# Patient Record
Sex: Female | Born: 1960 | Race: White | Hispanic: No | Marital: Married | Smoking: Former smoker
Health system: Southern US, Community
[De-identification: ages and names within clinical notes are randomized; demographics above are authoritative.]

## PROBLEM LIST (undated history)

## (undated) DIAGNOSIS — D649 Anemia, unspecified: Secondary | ICD-10-CM

## (undated) DIAGNOSIS — F419 Anxiety disorder, unspecified: Secondary | ICD-10-CM

## (undated) HISTORY — PX: TUBAL LIGATION: SHX77

## (undated) HISTORY — PX: COLONOSCOPY: SHX174

---

## 2016-11-17 ENCOUNTER — Other Ambulatory Visit: Payer: Self-pay | Admitting: Obstetrics and Gynecology

## 2016-11-17 DIAGNOSIS — Z1231 Encounter for screening mammogram for malignant neoplasm of breast: Secondary | ICD-10-CM

## 2016-12-09 ENCOUNTER — Encounter: Payer: Self-pay | Admitting: Radiology

## 2016-12-09 ENCOUNTER — Ambulatory Visit
Admission: RE | Admit: 2016-12-09 | Discharge: 2016-12-09 | Disposition: A | Discharge status: Home / Self Care | Payer: Managed Care, Other (non HMO) | Source: Ambulatory Visit | Attending: Obstetrics and Gynecology | Admitting: Obstetrics and Gynecology

## 2016-12-09 DIAGNOSIS — Z1231 Encounter for screening mammogram for malignant neoplasm of breast: Secondary | ICD-10-CM | POA: Diagnosis not present

## 2016-12-14 ENCOUNTER — Inpatient Hospital Stay
Admission: RE | Admit: 2016-12-14 | Discharge: 2016-12-14 | Disposition: A | Discharge status: Home / Self Care | Payer: Self-pay | Source: Ambulatory Visit | Attending: *Deleted | Admitting: *Deleted

## 2016-12-14 ENCOUNTER — Other Ambulatory Visit: Payer: Self-pay | Admitting: *Deleted

## 2016-12-14 DIAGNOSIS — Z9289 Personal history of other medical treatment: Secondary | ICD-10-CM

## 2017-11-17 ENCOUNTER — Other Ambulatory Visit: Payer: Self-pay | Admitting: Obstetrics and Gynecology

## 2017-11-17 DIAGNOSIS — Z1231 Encounter for screening mammogram for malignant neoplasm of breast: Secondary | ICD-10-CM

## 2017-12-20 ENCOUNTER — Ambulatory Visit
Admission: RE | Admit: 2017-12-20 | Discharge: 2017-12-20 | Disposition: A | Discharge status: Home / Self Care | Payer: Managed Care, Other (non HMO) | Source: Ambulatory Visit | Attending: Obstetrics and Gynecology | Admitting: Obstetrics and Gynecology

## 2017-12-20 DIAGNOSIS — Z1231 Encounter for screening mammogram for malignant neoplasm of breast: Secondary | ICD-10-CM | POA: Insufficient documentation

## 2018-11-21 ENCOUNTER — Other Ambulatory Visit: Payer: Self-pay | Admitting: Obstetrics and Gynecology

## 2018-11-21 DIAGNOSIS — Z1231 Encounter for screening mammogram for malignant neoplasm of breast: Secondary | ICD-10-CM

## 2019-01-16 ENCOUNTER — Ambulatory Visit
Admission: RE | Admit: 2019-01-16 | Discharge: 2019-01-16 | Disposition: A | Discharge status: Home / Self Care | Payer: Managed Care, Other (non HMO) | Source: Ambulatory Visit | Attending: Obstetrics and Gynecology | Admitting: Obstetrics and Gynecology

## 2019-01-16 DIAGNOSIS — Z1231 Encounter for screening mammogram for malignant neoplasm of breast: Secondary | ICD-10-CM | POA: Insufficient documentation

## 2019-01-18 ENCOUNTER — Other Ambulatory Visit: Payer: Self-pay

## 2019-01-18 DIAGNOSIS — Z20822 Contact with and (suspected) exposure to covid-19: Secondary | ICD-10-CM

## 2019-01-19 LAB — NOVEL CORONAVIRUS, NAA: SARS-CoV-2, NAA: NOT DETECTED

## 2019-01-19 LAB — SPECIMEN STATUS REPORT

## 2019-03-02 ENCOUNTER — Other Ambulatory Visit: Payer: Self-pay

## 2019-03-02 DIAGNOSIS — Z20822 Contact with and (suspected) exposure to covid-19: Secondary | ICD-10-CM

## 2019-03-03 LAB — NOVEL CORONAVIRUS, NAA: SARS-CoV-2, NAA: NOT DETECTED

## 2019-03-09 ENCOUNTER — Other Ambulatory Visit: Payer: Self-pay | Admitting: Internal Medicine

## 2019-03-09 DIAGNOSIS — Z20822 Contact with and (suspected) exposure to covid-19: Secondary | ICD-10-CM

## 2019-03-10 LAB — NOVEL CORONAVIRUS, NAA: SARS-CoV-2, NAA: NOT DETECTED

## 2019-08-30 ENCOUNTER — Ambulatory Visit: Payer: Managed Care, Other (non HMO) | Attending: Internal Medicine

## 2019-08-30 DIAGNOSIS — Z23 Encounter for immunization: Secondary | ICD-10-CM

## 2019-08-30 NOTE — Progress Notes (Signed)
   Covid-19 Vaccination Clinic  Name:  Terina Mcelhinny    MRN: [id number] DOB: April 02, 1961  08/30/2019  Ms. Salatino was observed post Covid-19 immunization for 15 minutes without incident. She was provided with Vaccine Information Sheet and instruction to access the V-Safe system.   Ms. Knoche was instructed to call 911 with any severe reactions post vaccine: Marland Kitchen Difficulty breathing  . Swelling of face and throat  . A fast heartbeat  . A bad rash all over body  . Dizziness and weakness   Immunizations Administered    Name Date Dose VIS Date Route   Pfizer COVID-19 Vaccine 08/30/2019 12:33 PM 0.3 mL 05/25/2019 Intramuscular   Manufacturer: [employer], [employer]   Lot: [id number]   NDC: [address]-7

## 2019-09-24 ENCOUNTER — Ambulatory Visit: Payer: Managed Care, Other (non HMO) | Attending: Internal Medicine

## 2019-09-24 DIAGNOSIS — Z23 Encounter for immunization: Secondary | ICD-10-CM

## 2019-09-24 NOTE — Progress Notes (Signed)
   Covid-19 Vaccination Clinic  Name:  Kimberly Barrett    MRN: [id number] DOB: [birth year]  09/24/2019  [name] was observed post Covid-19 immunization for 15 minutes without incident. She was provided with Vaccine Information Sheet and instruction to access the V-Safe system.   [name] was instructed to call 911 with any severe reactions post vaccine: Marland Kitchen Difficulty breathing  . Swelling of face and throat  . A fast heartbeat  . A bad rash all over body  . Dizziness and weakness   Immunizations Administered    Name Date Dose VIS Date Route   Pfizer COVID-19 Vaccine 09/24/2019 11:01 AM 0.3 mL 05/25/2019 Intramuscular   Manufacturer: [employer], [employer]   Lot: [id number]   NDC: [address]-9

## 2020-02-21 ENCOUNTER — Other Ambulatory Visit: Payer: Self-pay | Admitting: Obstetrics and Gynecology

## 2020-02-21 DIAGNOSIS — Z1231 Encounter for screening mammogram for malignant neoplasm of breast: Secondary | ICD-10-CM

## 2020-03-12 ENCOUNTER — Other Ambulatory Visit: Payer: Self-pay

## 2020-03-12 ENCOUNTER — Ambulatory Visit
Admission: RE | Admit: 2020-03-12 | Discharge: 2020-03-12 | Disposition: A | Discharge status: Home / Self Care | Payer: Managed Care, Other (non HMO) | Source: Ambulatory Visit | Attending: Obstetrics and Gynecology | Admitting: Obstetrics and Gynecology

## 2020-03-12 DIAGNOSIS — Z1231 Encounter for screening mammogram for malignant neoplasm of breast: Secondary | ICD-10-CM | POA: Diagnosis not present

## 2020-04-14 DIAGNOSIS — T148XXA Other injury of unspecified body region, initial encounter: Secondary | ICD-10-CM

## 2020-04-14 HISTORY — DX: Other injury of unspecified body region, initial encounter: T14.8XXA

## 2020-04-29 NOTE — H&P (Signed)
Kimberly Barrett is a 59 y.o. female here for Follow-up (discuss next steps for endometrial polyps, possible pre-op?)  Referring provider: Christy Gentles*  History of Present Illness: Patient returns for a f/u regarding endometrial biopsy pathology (done 03/25/2020) of: Benign endometrial polyp, no hyperplasia or carcinoma.  EMBx indications: postmenopausal bleeding, complex thickened endometrial stripe measuring 21.69mm on [place].              Current proposed plan: await pathology results, plan for saline sonogram [place] followed likely by D&C. Consider progesterone if indicated.  Pt had presented to her annual in 02/2020 & reported an episode of PMB several months prior to visit. The bleeding episode lasted 3 months but had stopped. However, when pt was seen for PMB work up in 03/2020, she reported that she had bled for 2 weeks after her annual visit in 02/2020 and it was heavier than last time and was painful.  Work up thus far: EMBx 03/2020: Benign endometrial polyp. No hyperplasia or carcinoma.  TVUS 03/2020: Uterusretroverted= 9.27 x 7.17 x 6.42cm Fibroids seen:1)Rt mid=4cm 2)Lt lateral=3.5cm Complex thickened endometrium = 21.52mm Free fluid seen in PCDS Lt simple ovarian cyst=1.5cm;  Rt ovary appears wnl  Pertinent Hx: -NSVD 1 for twin daughters, s/p BTL -Started poestrogen 1 mg & po prometrium 100mg in 2017 for vasomotor symptoms.  -HRT with good results  Past Medical History:  has a past medical history of Anxiety and Postmenopausal symptoms (12/21/2016).  Past Surgical History:  has a past surgical history that includes Tubal ligation 02/21/2017) and Colonoscopy (11/21/2017). Family History: family history includes Alcohol abuse in her brother and maternal grandmother; Alzheimer's disease in her father; Arthritis in her mother; Colon polyps in her father; No Known Problems in her daughter and daughter; Stroke in her father and paternal grandmother. Social History:   reports that she quit smoking about 13 years ago. She has a 25.00 pack-year smoking history. She has never used smokeless tobacco. She reports current alcohol use of about 3.0 standard drinks of alcohol per week. She reports that she does not use drugs. OB/GYN History:          OB History    Gravida  1   Para  1   Term  1   Preterm      AB      Living  2     SAB      IAB      Ectopic      Molar      Multiple      Live Births            Allergies: has No Known Allergies. Medications: Current Outpatient Medications:  .  escitalopram oxalate (LEXAPRO) 10 MG tablet, Take 1 tablet (10 mg total) by mouth once daily, Disp: 90 tablet, Rfl: 3 .  estradioL (ESTRACE) 1 MG tablet, TAKE 1 TABLET BY MOUTH EVERY DAY, Disp: 90 tablet, Rfl: 3 .  HYDROcodone-acetaminophen (NORCO) 7.5-325 mg tablet, , Disp: , Rfl:  .  progesterone (PROMETRIUM) 100 MG capsule, TAKE 1 CAPSULE (100 MG TOTAL) BY MOUTH ONCE DAILY, Disp: 90 capsule, Rfl: 3  Review of Systems: No SOB, no palpitations or chest pain, no new lower extremity edema, no nausea or vomiting or bowel or bladder complaints. See HPI for gyn specific ROS.   Exam:      Vitals:   04/21/20 0906  BP: 118/76    Lungs: CTA  CV : RRR without murmur   Breast: deferred Neck: No thyromegaly  Impression:   The primary encounter diagnosis was Postmenopausal bleeding. Diagnoses of Thickened endometrium and Endometrial polyp were also pertinent to this visit.  Plan:   - -  Preoperative visit: D&C hysteroscopy, polypectomy. Consents signed today. Risks of surgery were discussed with the patient including but not limited to: bleeding which may require transfusion; infection which may require antibiotics; injury to uterus or surrounding organs; intrauterine scarring which may impair future fertility; need for additional procedures including laparotomy or laparoscopy; and other postoperative/anesthesia complications. Written  informed consent was obtained.  This is a scheduled same-day surgery. She will have a postop visit in 2 weeks to review operative findings and pathology.  25 min spent with the patient, with >50% spent in counseling and clinical decision making  Diagnoses and all orders for this visit:  Postmenopausal bleeding  Thickened endometrium  Endometrial polyp

## 2020-04-30 ENCOUNTER — Other Ambulatory Visit: Payer: Self-pay | Admitting: Obstetrics and Gynecology

## 2020-05-06 ENCOUNTER — Inpatient Hospital Stay: Admission: RE | Admit: 2020-05-06 | Payer: Managed Care, Other (non HMO) | Source: Ambulatory Visit

## 2020-05-07 ENCOUNTER — Other Ambulatory Visit: Payer: Self-pay

## 2020-05-07 ENCOUNTER — Encounter
Admission: RE | Admit: 2020-05-07 | Discharge: 2020-05-07 | Disposition: A | Discharge status: Home / Self Care | Payer: Managed Care, Other (non HMO) | Source: Ambulatory Visit | Attending: Obstetrics and Gynecology | Admitting: Obstetrics and Gynecology

## 2020-05-07 HISTORY — DX: Anxiety disorder, unspecified: F41.9

## 2020-05-07 NOTE — Patient Instructions (Signed)
Your procedure is scheduled on: 05-16-20  Friday  Report to the Registration Desk on the 1st floor of the [facility]. To find out your arrival time, please call [phone] between 1PM - 3PM on: 05-15-2020 THURSDAY  REMEMBER: Instructions that are not followed completely may result in serious medical risk, up to and including death; or upon the discretion of your surgeon and anesthesiologist your surgery may need to be rescheduled.  Do not eat food after midnight the night before surgery.  No gum chewing, lozengers or hard candies.  You may however, drink CLEAR liquids up to 2 hours before you are scheduled to arrive for your surgery. Do not drink anything within 2 hours of your scheduled arrival time.  Clear liquids include: - water  - apple juice without pulp - black coffee or tea (Do NOT add milk or creamers to the coffee or tea) Do NOT drink anything that is not on this list.  Type 1 and Type 2 diabetics should only drink water.  In addition, your doctor has ordered for you to drink the provided  Ensure Pre-Surgery Clear Carbohydrate Drink  Drinking this carbohydrate drink up to two hours before surgery helps to reduce insulin resistance and improve patient outcomes. Please complete drinking 2 hours prior to scheduled arrival time.  TAKE THESE MEDICATIONS THE MORNING OF SURGERY WITH A SIP OF WATER: ESCITALOPRAM ESTRADIOL PROGESTONE  One week prior to surgery: Stop Anti-inflammatories (NSAIDS) such as Advil, Aleve, Ibuprofen, Motrin, Naproxen, Naprosyn and ASPIRIN OR Aspirin based products such as Excedrin, Goodys Powder, BC Powder. USE ONLY TYLENOL Stop ANY OVER THE COUNTER supplements until after surgery. (However, you may continue taking Vitamin D, Vitamin B, and multivitamin up until the day before surgery.)  No Alcohol for 24 hours before or after surgery.  No Smoking including e-cigarettes for 24 hours prior to surgery.  No chewable tobacco products for at least 6  hours prior to surgery.  No nicotine patches on the day of surgery.  Do not use any "recreational" drugs for at least a week prior to your surgery.  Please be advised that the combination of cocaine and anesthesia may have negative outcomes, up to and including death. If you test positive for cocaine, your surgery will be cancelled.  On the morning of surgery brush your teeth with toothpaste and water, you may rinse your mouth with mouthwash if you wish. Do not swallow any toothpaste or mouthwash.  Do not wear jewelry, make-up, hairpins, clips or nail polish.  Do not wear lotions, powders, or perfumes OR DEODORANT   Do not shave body from the neck down 48 hours prior to surgery just in case you cut yourself which could leave a site for infection.  Also, freshly shaved skin may become irritated if using the CHG soap.  Contact lenses, hearing aids and dentures may not be worn into surgery.  Do not bring valuables to the hospital. [facility] is not responsible for any missing/lost belongings or valuables.   SHOWER MORNING OF SURGERY  Notify your doctor if there is any change in your medical condition (cold, fever, infection).  Wear comfortable clothing (specific to your surgery type) to the hospital.  Plan for stool softeners for home use; pain medications have a tendency to cause constipation. You can also help prevent constipation by eating foods high in fiber such as fruits and vegetables and drinking plenty of fluids as your diet allows.  After surgery, you can help prevent lung complications by doing  breathing exercises.  Take deep breaths and cough every 1-2 hours. Your doctor may order a device called an Incentive Spirometer to help you take deep breaths. When coughing or sneezing, hold a pillow firmly against your incision with both hands. This is called "splinting." Doing this helps protect your incision. It also decreases belly discomfort.  If you are being discharged the  day of surgery, you will not be allowed to drive home. You will need a responsible adult (18 years or older) to drive you home and stay with you that night.   If you are taking public transportation, you will need to have a responsible adult (18 years or older) with you. Please confirm with your physician that it is acceptable to use public transportation.   Please call the Pre-admissions Testing Dept. at [phone] if you have any questions about these instructions.  Visitation Policy:  Patients undergoing a surgery or procedure may have one family member or support person with them as long as that person is not COVID-19 positive or experiencing its symptoms.  That person may remain in the waiting area during the procedure.  Inpatient Visitation Update:   In an effort to ensure the safety of our team members and our patients, we are implementing a change to our visitation policy:  Effective Monday, Aug. 9, at 7 a.m., inpatients will be allowed one support person.  o The support person may change daily.  o The support person must pass our screening, gel in and out, and wear a mask at all times, including in the patient's room.  o Patients must also wear a mask when staff or their support person are in the room.  o Masking is required regardless of vaccination status.  Systemwide, no visitors 17 or younger.

## 2020-05-14 ENCOUNTER — Other Ambulatory Visit
Admission: RE | Admit: 2020-05-14 | Discharge: 2020-05-14 | Disposition: A | Discharge status: Home / Self Care | Payer: Managed Care, Other (non HMO) | Source: Ambulatory Visit | Attending: Obstetrics and Gynecology | Admitting: Obstetrics and Gynecology

## 2020-05-14 ENCOUNTER — Other Ambulatory Visit: Payer: Self-pay

## 2020-05-14 DIAGNOSIS — Z20822 Contact with and (suspected) exposure to covid-19: Secondary | ICD-10-CM | POA: Insufficient documentation

## 2020-05-14 DIAGNOSIS — Z01812 Encounter for preprocedural laboratory examination: Secondary | ICD-10-CM | POA: Insufficient documentation

## 2020-05-14 LAB — CBC
HCT: 37.4 % (ref 36.0–46.0)
Hemoglobin: 12.5 g/dL (ref 12.0–15.0)
MCH: 31.3 pg (ref 26.0–34.0)
MCHC: 33.4 g/dL (ref 30.0–36.0)
MCV: 93.7 fL (ref 80.0–100.0)
Platelets: 215 10*3/uL (ref 150–400)
RBC: 3.99 MIL/uL (ref 3.87–5.11)
RDW: 10.9 % — ABNORMAL LOW (ref 11.5–15.5)
WBC: 6 10*3/uL (ref 4.0–10.5)
nRBC: 0 % (ref 0.0–0.2)

## 2020-05-14 LAB — BASIC METABOLIC PANEL
Anion gap: 8 (ref 5–15)
BUN: 17 mg/dL (ref 8–23)
CO2: 26 mmol/L (ref 22–32)
Calcium: 8.5 mg/dL — ABNORMAL LOW (ref 8.9–10.3)
Chloride: 103 mmol/L (ref 98–111)
Creatinine, Ser: 0.71 mg/dL (ref 0.44–1.00)
GFR, Estimated: >60 mL/min (ref >60)
Glucose, Bld: 93 mg/dL (ref 70–99)
Potassium: 4.1 mmol/L (ref 3.5–5.1)
Sodium: 137 mmol/L (ref 135–145)

## 2020-05-14 LAB — TYPE AND SCREEN
ABO/RH(D): O POS
Antibody Screen: NEGATIVE

## 2020-05-14 LAB — SARS CORONAVIRUS 2 (TAT 6-24 HRS): SARS Coronavirus 2: NEGATIVE

## 2020-05-15 MED ORDER — CHLORHEXIDINE GLUCONATE 0.12 % MT SOLN
15.0000 mL | Freq: Once | OROMUCOSAL | Status: AC
  Administered 2020-05-16: 15 mL via OROMUCOSAL

## 2020-05-15 MED ORDER — FAMOTIDINE 20 MG PO TABS
20.0000 mg | ORAL_TABLET | Freq: Once | ORAL | Status: AC
  Administered 2020-05-16: 20 mg via ORAL

## 2020-05-15 MED ORDER — ORAL CARE MOUTH RINSE: 15.0000 mL | Freq: Once | OROMUCOSAL | Status: AC

## 2020-05-15 MED ORDER — LACTATED RINGERS IV SOLN: INTRAVENOUS | Status: DC

## 2020-05-16 ENCOUNTER — Ambulatory Visit: Payer: Managed Care, Other (non HMO) | Admitting: Anesthesiology

## 2020-05-16 ENCOUNTER — Other Ambulatory Visit: Payer: Self-pay

## 2020-05-16 ENCOUNTER — Ambulatory Visit
Admission: RE | Admit: 2020-05-16 | Discharge: 2020-05-16 | Disposition: A | Discharge status: Home / Self Care | Payer: Managed Care, Other (non HMO) | Attending: Obstetrics and Gynecology | Admitting: Obstetrics and Gynecology

## 2020-05-16 ENCOUNTER — Encounter: Payer: Self-pay | Admitting: Obstetrics and Gynecology

## 2020-05-16 ENCOUNTER — Encounter
Admission: RE | Disposition: A | Discharge status: Home / Self Care | Payer: Self-pay | Source: Home / Self Care | Attending: Obstetrics and Gynecology

## 2020-05-16 DIAGNOSIS — N840 Polyp of corpus uteri: Secondary | ICD-10-CM | POA: Insufficient documentation

## 2020-05-16 DIAGNOSIS — Z79899 Other long term (current) drug therapy: Secondary | ICD-10-CM | POA: Diagnosis not present

## 2020-05-16 DIAGNOSIS — Z79890 Hormone replacement therapy (postmenopausal): Secondary | ICD-10-CM | POA: Insufficient documentation

## 2020-05-16 DIAGNOSIS — Z87891 Personal history of nicotine dependence: Secondary | ICD-10-CM | POA: Diagnosis not present

## 2020-05-16 DIAGNOSIS — R9389 Abnormal findings on diagnostic imaging of other specified body structures: Secondary | ICD-10-CM | POA: Diagnosis present

## 2020-05-16 DIAGNOSIS — N950 Postmenopausal bleeding: Secondary | ICD-10-CM | POA: Diagnosis not present

## 2020-05-16 HISTORY — PX: HYSTEROSCOPY WITH D & C: SHX1775

## 2020-05-16 LAB — ABO/RH: ABO/RH(D): O POS

## 2020-05-16 SURGERY — DILATATION AND CURETTAGE /HYSTEROSCOPY
Anesthesia: General

## 2020-05-16 MED ORDER — PHENYLEPHRINE HCL (PRESSORS) 10 MG/ML IV SOLN
INTRAVENOUS | Status: DC | PRN
  Administered 2020-05-16 (×2): 100 ug via INTRAVENOUS

## 2020-05-16 MED ORDER — MEPERIDINE HCL 50 MG/ML IJ SOLN: 6.2500 mg | INTRAMUSCULAR | Status: DC | PRN

## 2020-05-16 MED ORDER — FAMOTIDINE 20 MG PO TABS
ORAL_TABLET | ORAL | Status: AC
  Filled 2020-05-16: qty 1

## 2020-05-16 MED ORDER — CHLORHEXIDINE GLUCONATE 0.12 % MT SOLN
OROMUCOSAL | Status: AC
  Filled 2020-05-16: qty 15

## 2020-05-16 MED ORDER — DROPERIDOL 2.5 MG/ML IJ SOLN
0.6250 mg | Freq: Once | INTRAMUSCULAR | Status: DC | PRN
  Filled 2020-05-16: qty 2

## 2020-05-16 MED ORDER — GLYCOPYRROLATE 0.2 MG/ML IJ SOLN
INTRAMUSCULAR | Status: DC | PRN
  Administered 2020-05-16: .2 mg via INTRAVENOUS

## 2020-05-16 MED ORDER — LIDOCAINE HCL (CARDIAC) PF 100 MG/5ML IV SOSY
PREFILLED_SYRINGE | INTRAVENOUS | Status: DC | PRN
  Administered 2020-05-16: 60 mg via INTRAVENOUS

## 2020-05-16 MED ORDER — HYDROMORPHONE HCL 1 MG/ML IJ SOLN
INTRAMUSCULAR | Status: AC
  Administered 2020-05-16: 0.25 mg via INTRAVENOUS
  Filled 2020-05-16: qty 1

## 2020-05-16 MED ORDER — LORAZEPAM 2 MG/ML IJ SOLN: 1.0000 mg | Freq: Once | INTRAMUSCULAR | Status: DC | PRN

## 2020-05-16 MED ORDER — OXYCODONE HCL 5 MG PO TABS
5.0000 mg | ORAL_TABLET | Freq: Once | ORAL | Status: AC | PRN
  Administered 2020-05-16: 5 mg via ORAL

## 2020-05-16 MED ORDER — OXYCODONE HCL 5 MG PO TABS
ORAL_TABLET | ORAL | Status: AC
  Filled 2020-05-16: qty 1

## 2020-05-16 MED ORDER — HYDROMORPHONE HCL 1 MG/ML IJ SOLN
0.2500 mg | INTRAMUSCULAR | Status: DC | PRN
  Administered 2020-05-16: 0.5 mg via INTRAVENOUS
  Administered 2020-05-16: 0.25 mg via INTRAVENOUS

## 2020-05-16 MED ORDER — DEXAMETHASONE SODIUM PHOSPHATE 10 MG/ML IJ SOLN
INTRAMUSCULAR | Status: DC | PRN
  Administered 2020-05-16: 10 mg via INTRAVENOUS

## 2020-05-16 MED ORDER — OXYCODONE HCL 5 MG/5ML PO SOLN: 5.0000 mg | Freq: Once | ORAL | Status: AC | PRN

## 2020-05-16 MED ORDER — PROMETHAZINE HCL 25 MG/ML IJ SOLN: 6.2500 mg | INTRAMUSCULAR | Status: DC | PRN

## 2020-05-16 MED ORDER — FENTANYL CITRATE (PF) 100 MCG/2ML IJ SOLN
INTRAMUSCULAR | Status: DC | PRN
  Administered 2020-05-16: 50 ug via INTRAVENOUS
  Administered 2020-05-16 (×2): 25 ug via INTRAVENOUS

## 2020-05-16 MED ORDER — PROPOFOL 10 MG/ML IV BOLUS
INTRAVENOUS | Status: DC | PRN
  Administered 2020-05-16: 130 mg via INTRAVENOUS

## 2020-05-16 MED ORDER — EPHEDRINE SULFATE 50 MG/ML IJ SOLN
INTRAMUSCULAR | Status: DC | PRN
  Administered 2020-05-16: 10 mg via INTRAVENOUS

## 2020-05-16 MED ORDER — FENTANYL CITRATE (PF) 100 MCG/2ML IJ SOLN
INTRAMUSCULAR | Status: AC
  Filled 2020-05-16: qty 2

## 2020-05-16 MED ORDER — ONDANSETRON HCL 4 MG/2ML IJ SOLN
INTRAMUSCULAR | Status: DC | PRN
  Administered 2020-05-16: 4 mg via INTRAVENOUS

## 2020-05-16 SURGICAL SUPPLY — 22 items
BAG INFUSER PRESSURE 100CC (MISCELLANEOUS) ×3 IMPLANT
CANISTER SUCT 3000ML PPV (MISCELLANEOUS) ×3 IMPLANT
CATH ROBINSON RED A/P 16FR (CATHETERS) ×3 IMPLANT
COVER WAND RF STERILE (DRAPES) ×3 IMPLANT
DEVICE MYOSURE REACH (MISCELLANEOUS) ×3 IMPLANT
ELECT REM PT RETURN 9FT ADLT (ELECTROSURGICAL) ×3
ELECTRODE REM PT RTRN 9FT ADLT (ELECTROSURGICAL) ×1 IMPLANT
GAUZE 4X4 16PLY RFD (DISPOSABLE) ×3 IMPLANT
GLOVE BIO SURGEON STRL SZ7 (GLOVE) ×3 IMPLANT
GLOVE INDICATOR 7.5 STRL GRN (GLOVE) ×3 IMPLANT
GOWN STRL REUS W/ TWL LRG LVL3 (GOWN DISPOSABLE) ×2 IMPLANT
GOWN STRL REUS W/TWL LRG LVL3 (GOWN DISPOSABLE) ×4
KIT PROCEDURE FLUENT (KITS) ×3 IMPLANT
KIT TURNOVER CYSTO (KITS) ×3 IMPLANT
MANIFOLD NEPTUNE II (INSTRUMENTS) ×3 IMPLANT
PACK DNC HYST (MISCELLANEOUS) ×3 IMPLANT
PAD OB MATERNITY 4.3X12.25 (PERSONAL CARE ITEMS) ×3 IMPLANT
PAD PREP 24X41 OB/GYN DISP (PERSONAL CARE ITEMS) ×3 IMPLANT
SOL .9 NS 3000ML IRR  AL (IV SOLUTION) ×2
SOL .9 NS 3000ML IRR UROMATIC (IV SOLUTION) ×1 IMPLANT
TUBING CONNECTING 10 (TUBING) ×2 IMPLANT
TUBING CONNECTING 10' (TUBING) ×1

## 2020-05-16 NOTE — Anesthesia Procedure Notes (Signed)
Procedure Name: LMA Insertion Date/Time: 05/16/2020 9:31 AM Performed by: Junious Silk, CRNA Pre-anesthesia Checklist: Patient identified, Patient being monitored, Timeout performed, Emergency Drugs available and Suction available Patient Re-evaluated:Patient Re-evaluated prior to induction Oxygen Delivery Method: Circle system utilized Preoxygenation: Pre-oxygenation with 100% oxygen Induction Type: IV induction Ventilation: Mask ventilation without difficulty LMA: LMA inserted LMA Size: 4.0 Tube type: Oral Number of attempts: 1 Placement Confirmation: positive ETCO2 and breath sounds checked- equal and bilateral Tube secured with: Tape Dental Injury: Teeth and Oropharynx as per pre-operative assessment

## 2020-05-16 NOTE — Op Note (Signed)
Operative Report Hysteroscopy with Dilation and Curettage   Indications: Postmenopausal bleeding   Pre-operative Diagnosis: Endometrial thickening   Post-operative Diagnosis: same.  Procedure: 1. Exam under anesthesia 2. Fractional D&C 3. Hysteroscopy 4. Myosure polypectomy  Surgeon: Christeen Douglas, MD  Assistant(s):  None  Anesthesia: General LMA anesthesia  Anesthesiologist: No responsible provider has been recorded for the case. Anesthesiologist: Emilio Math, DO CRNA: Junious Silk, CRNA  Estimated Blood Loss:  Minimal         Total IV Fluids:  Urine Output:  Total Fluid Deficit:  5 mL          Specimens: Endocervical curettings, endometrial curettings         Complications:  None; patient tolerated the procedure well.         Disposition: PACU - hemodynamically stable.         Condition: stable  Findings: Uterus measuring 10 cm by sound; normal cervix, vagina, perineum. Large fundal endometrial polyp, normal atrophic endometrium  Indication for procedure/Consents: 59 y.o. F here for scheduled surgery for the aforementioned diagnoses.   Risks of surgery were discussed with the patient including but not limited to: bleeding which may require transfusion; infection which may require antibiotics; injury to uterus or surrounding organs; intrauterine scarring which may impair future fertility; need for additional procedures including laparotomy or laparoscopy; and other postoperative/anesthesia complications. Written informed consent was obtained.    Procedure Details:   D&C/ Myosure  The patient was taken to the operating room where anesthesia was administered and was found to be adequate. After a formal and adequate timeout was performed, she was placed in the dorsal lithotomy position and examined with the above findings. She was then prepped and draped in the sterile manner. Her bladder was catheterized for an estimated amount of clear, yellow  urine. A weighed speculum was then placed in the patient's vagina and a single tooth tenaculum was applied to the anterior lip of the cervix.  Her cervix was serially dilated to 17 [place] using Hanks dilators. An ECC was performed. The hysteroscope was introduced under direct observation  Using lactated ringers as a distention medium to reveal the above findings. The uterine cavity was carefully examined, both ostia were recognized, and diffusely proliferative endometrium with the polyp above were noted.   This was resected using the Myosure device.  After further careful visualization of the uterine cavity, the hysteroscope was removed under direct visualization.  A sharp curettage was then performed until there was a gritty texture in all four quadrants. The tenaculum was removed from the anterior lip of the cervix and the vaginal speculum was removed after applying silver nitrate for good hemostasis.   The patient tolerated the procedure well and was taken to the recovery area awake and in stable condition. She received iv acetaminophen and Toradol prior to leaving the OR.  The patient will be discharged to home as per PACU criteria. Routine postoperative instructions given. She was prescribed Ibuprofen and Colace. She will follow up in the clinic in two weeks for postoperative evaluation.

## 2020-05-16 NOTE — Transfer of Care (Signed)
Immediate Anesthesia Transfer of Care Note  Patient: Kimberly Barrett  Procedure(s) Performed: DILATATION AND CURETTAGE /HYSTEROSCOPY, POLYPECTOMY (N/A )  Patient Location: PACU  Anesthesia Type:General  Level of Consciousness: awake, alert  and oriented  Airway & Oxygen Therapy: Patient Spontanous Breathing and Patient connected to face mask oxygen  Post-op Assessment: Report given to RN and Post -op Vital signs reviewed and stable  Post vital signs: Reviewed and stable  Last Vitals:  Vitals Value Taken Time  BP    Temp    Pulse    Resp    SpO2      Last Pain:  Vitals:   05/16/20 0837  TempSrc: Oral  PainSc: 0-No pain         Complications: No complications documented.

## 2020-05-16 NOTE — Anesthesia Postprocedure Evaluation (Signed)
Anesthesia Post Note  Patient: Kimberly Barrett  Procedure(s) Performed: DILATATION AND CURETTAGE /HYSTEROSCOPY, POLYPECTOMY (N/A )  Patient location during evaluation: PACU Anesthesia Type: General Level of consciousness: awake Pain management: pain level controlled Vital Signs Assessment: post-procedure vital signs reviewed and stable Respiratory status: spontaneous breathing Cardiovascular status: blood pressure returned to baseline and stable Postop Assessment: no apparent nausea or vomiting Anesthetic complications: no   No complications documented.   Last Vitals:  Vitals:   05/16/20 0837  BP: 125/87  Resp: 16  Temp: 36.7 C  SpO2: 100%    Last Pain:  Vitals:   05/16/20 0837  TempSrc: Oral  PainSc: 0-No pain                 Emilio Math

## 2020-05-16 NOTE — Anesthesia Postprocedure Evaluation (Signed)
Anesthesia Post Note  Patient: Kimberly Barrett  Procedure(s) Performed: DILATATION AND CURETTAGE /HYSTEROSCOPY, POLYPECTOMY (N/A )  Patient location during evaluation: PACU Anesthesia Type: General Level of consciousness: awake Pain management: pain level controlled Vital Signs Assessment: post-procedure vital signs reviewed and stable Respiratory status: spontaneous breathing Cardiovascular status: blood pressure returned to baseline Postop Assessment: no apparent nausea or vomiting Anesthetic complications: no   No complications documented.   Last Vitals:  Vitals:   05/16/20 1048 05/16/20 1102  BP: 99/65 126/61  Pulse: 71 72  Resp: 14 18  Temp: 36.6 C (!) 36.1 C  SpO2: 96% 98%    Last Pain:  Vitals:   05/16/20 1102  TempSrc: Temporal  PainSc: 4                  Emilio Math

## 2020-05-16 NOTE — Anesthesia Preprocedure Evaluation (Signed)
Anesthesia Evaluation  Patient identified by MRN, date of birth, ID band Patient awake    Reviewed: Allergy & Precautions, NPO status , Patient's Chart, lab work & pertinent test results  Airway Mallampati: II       Dental  (+) Caps,    Pulmonary neg pulmonary ROS, former smoker,    Pulmonary exam normal breath sounds clear to auscultation       Cardiovascular negative cardio ROS Normal cardiovascular exam Rhythm:Regular Rate:Normal     Neuro/Psych PSYCHIATRIC DISORDERS Anxiety negative neurological ROS     GI/Hepatic negative GI ROS, Neg liver ROS,   Endo/Other  negative endocrine ROS  Renal/GU negative Renal ROS  negative genitourinary   Musculoskeletal negative musculoskeletal ROS (+)   Abdominal   Peds negative pediatric ROS (+)  Hematology negative hematology ROS (+)   Anesthesia Other Findings Past Medical History: No date: Anxiety 04/2020: Fracture     Comment:  LEFT FOOT   Reproductive/Obstetrics negative OB ROS                             Anesthesia Physical Anesthesia Plan  ASA: II  Anesthesia Plan: General   Post-op Pain Management:    Induction: Intravenous  PONV Risk Score and Plan: 3  Airway Management Planned: LMA  Additional Equipment:   Intra-op Plan:   Post-operative Plan: Extubation in OR  Informed Consent: I have reviewed the patients History and Physical, chart, labs and discussed the procedure including the risks, benefits and alternatives for the proposed anesthesia with the patient or authorized representative who has indicated his/her understanding and acceptance.     Dental advisory given  Plan Discussed with: CRNA, Anesthesiologist and Surgeon  Anesthesia Plan Comments:         Anesthesia Quick Evaluation

## 2020-05-16 NOTE — Interval H&P Note (Signed)
History and Physical Interval Note:  05/16/2020 8:41 AM  Kimberly Barrett  has presented today for surgery, with the diagnosis of post menopausal bleeding; endometrial polyp.  The various methods of treatment have been discussed with the patient and family. After consideration of risks, benefits and other options for treatment, the patient has consented to  Procedure(s): DILATATION AND CURETTAGE /HYSTEROSCOPY, POLYPECTOMY (N/A) as a surgical intervention.  The patient's history has been reviewed, patient examined, no change in status, stable for surgery.  I have reviewed the patient's chart and labs.  Questions were answered to the patient's satisfaction.     Christeen Douglas

## 2020-05-16 NOTE — Discharge Instructions (Signed)
Discharge instructions after a hysteroscopy with dilation and curettage  Signs and Symptoms to Report  Call our office at [phone] if you have any of the following:   . Fever over 100.4 degrees or higher . Severe stomach pain not relieved with pain medications . Bright red bleeding that's heavier than a period that does not slow with rest after the first 24 hours . To go the bathroom a lot (frequency), you can't hold your urine (urgency), or it hurts when you empty your bladder (urinate) . Chest pain . Shortness of breath . Pain in the calves of your legs . Severe nausea and vomiting not relieved with anti-nausea medications . Any concerns  What You Can Expect after Surgery . You may see some pink tinged, bloody fluid. This is normal. You may also have cramping for several days.   Activities after Your Discharge Follow these guidelines to help speed your recovery at home: . Don't drive if you are in pain or taking narcotic pain medicine. You may drive when you can safely slam on the brakes, turn the wheel forcefully, and rotate your torso comfortably. This is typically 4-7 days. Practice in a parking lot or side street prior to attempting to drive regularly.  . Ask others to help with household chores for 4 weeks. . Don't do strenuous activities, exercises, or sports like vacuuming, tennis, squash, etc. until your doctor says it is safe to do so. . Walk as you feel able. Rest often since it may take a week or two for your energy level to return to normal.  . You may climb stairs . Avoid constipation:   -Eat fruits, vegetables, and whole grains. Eat small meals as your appetite will take time to return to normal.   -Drink 6 to 8 glasses of water each day unless your doctor has told you to limit your fluids.   -Use a laxative or stool softener as needed if constipation becomes a problem. You may take Miralax, metamucil, Citrucil, Colace, Senekot, FiberCon, etc. If this does not  relieve the constipation, try two tablespoons of Milk Of Magnesia every 8 hours until your bowels move.  . You may shower.  . Do not get in a hot tub, swimming pool, etc. until your doctor agrees. . Do not douche, use tampons, or have sex until your doctor says it is okay, usually about 2 weeks. . Take your pain medicine when you need it. The medicine may not work as well if the pain is bad.  Take the medicines you were taking before surgery. Other medications you might need are pain medications (ibuprofen), medications for constipation (Colace) and nausea medications (Zofran).        AMBULATORY SURGERY  DISCHARGE INSTRUCTIONS   1) The drugs that you were given will stay in your system until tomorrow so for the next 24 hours you should not:  A) Drive an automobile B) Make any legal decisions C) Drink any alcoholic beverage   2) You may resume regular meals tomorrow.  Today it is better to start with liquids and gradually work up to solid foods.  You may eat anything you prefer, but it is better to start with liquids, then soup and crackers, and gradually work up to solid foods.   3) Please notify your doctor immediately if you have any unusual bleeding, trouble breathing, redness and pain at the surgery site, drainage, fever, or pain not relieved by medication.    4) Additional Instructions:          Please contact your physician with any problems or Same Day Surgery at [phone], Monday through Friday 6 am to 4 pm, or [facility] Main number at [phone]. 

## 2020-05-20 LAB — SURGICAL PATHOLOGY

## 2021-03-10 ENCOUNTER — Other Ambulatory Visit: Payer: Self-pay

## 2021-03-10 ENCOUNTER — Other Ambulatory Visit: Payer: Self-pay | Admitting: Obstetrics and Gynecology

## 2021-03-10 ENCOUNTER — Ambulatory Visit
Admission: RE | Admit: 2021-03-10 | Discharge: 2021-03-10 | Disposition: A | Discharge status: Home / Self Care | Payer: Managed Care, Other (non HMO) | Source: Ambulatory Visit | Attending: Obstetrics and Gynecology | Admitting: Obstetrics and Gynecology

## 2021-03-10 DIAGNOSIS — Z1231 Encounter for screening mammogram for malignant neoplasm of breast: Secondary | ICD-10-CM | POA: Diagnosis present

## 2021-11-14 IMAGING — MG MM DIGITAL SCREENING BILAT W/ TOMO AND CAD
8 series · 8 of 24 positions shown · non-contrast
Comparison: Previous exam(s).

CLINICAL DATA: Screening.

EXAM:
DIGITAL SCREENING BILATERAL MAMMOGRAM WITH TOMOSYNTHESIS AND CAD
TECHNIQUE: Bilateral screening digital craniocaudal and mediolateral oblique
mammograms were obtained. Bilateral screening digital breast
tomosynthesis was performed. The images were evaluated with
computer-aided detection.

[R MLO synth-2D]
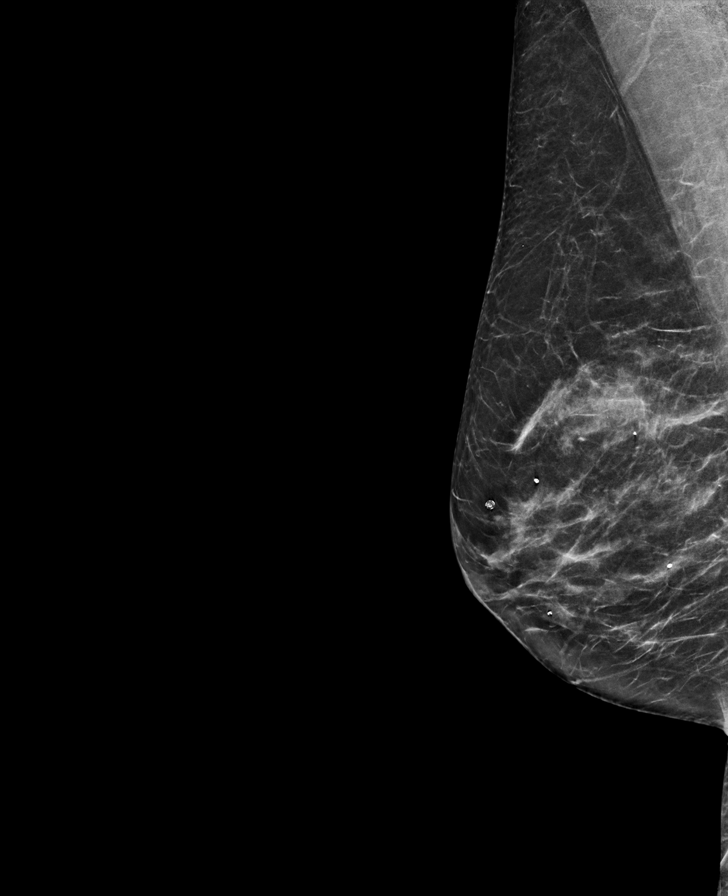

[L CC synth-2D]
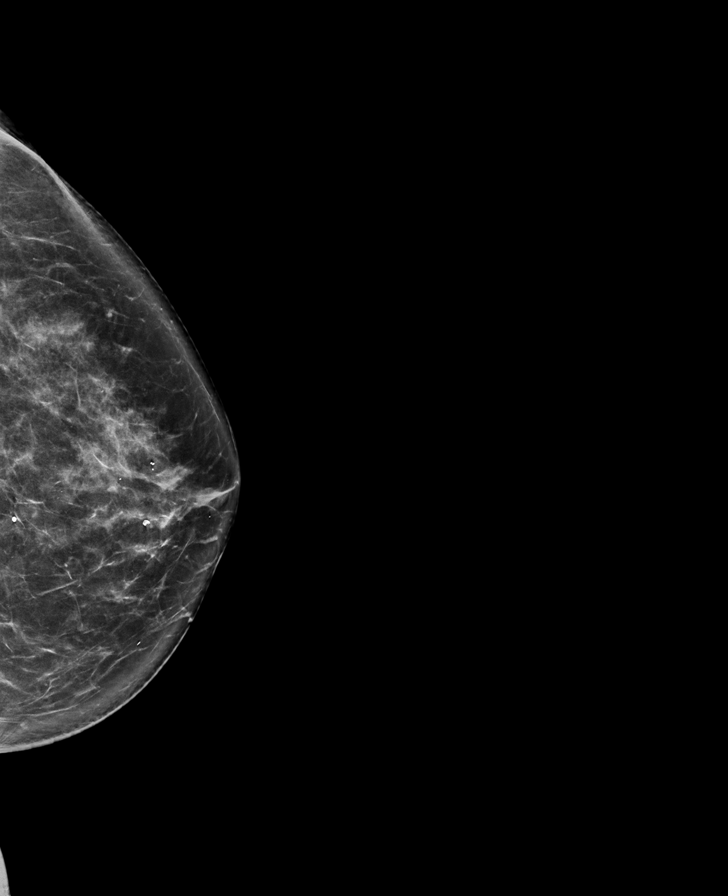

[L MLO synth-2D]
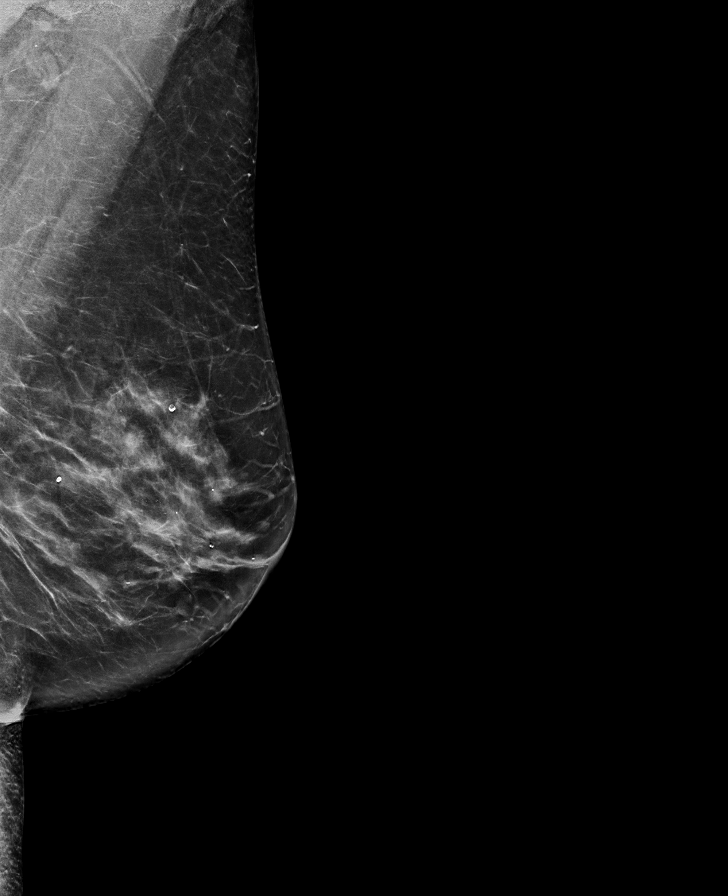

[R CC synth-2D]
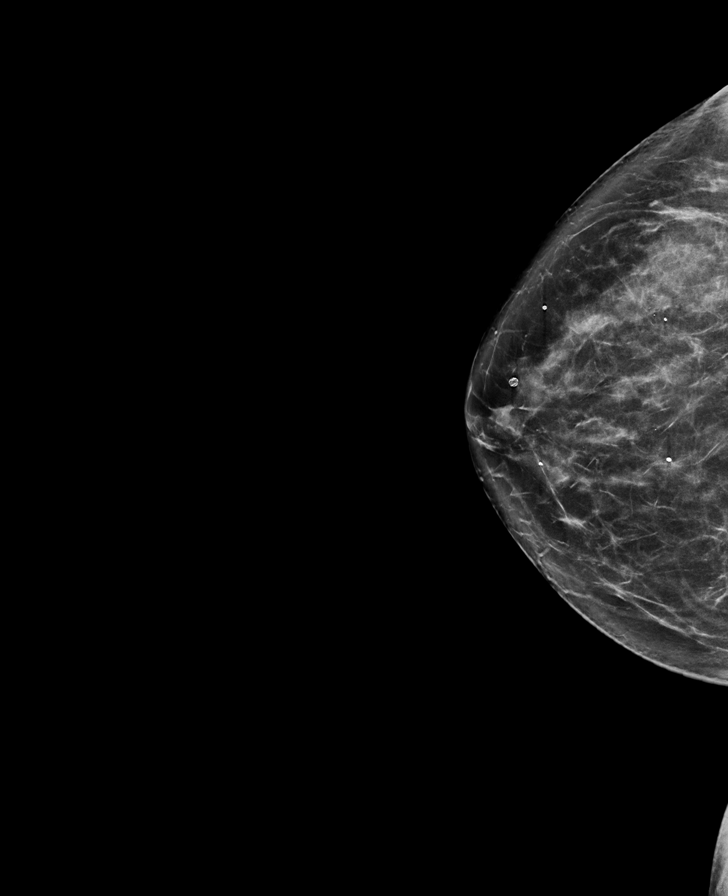

[L CC tomo · tomo slice 40/79.0]
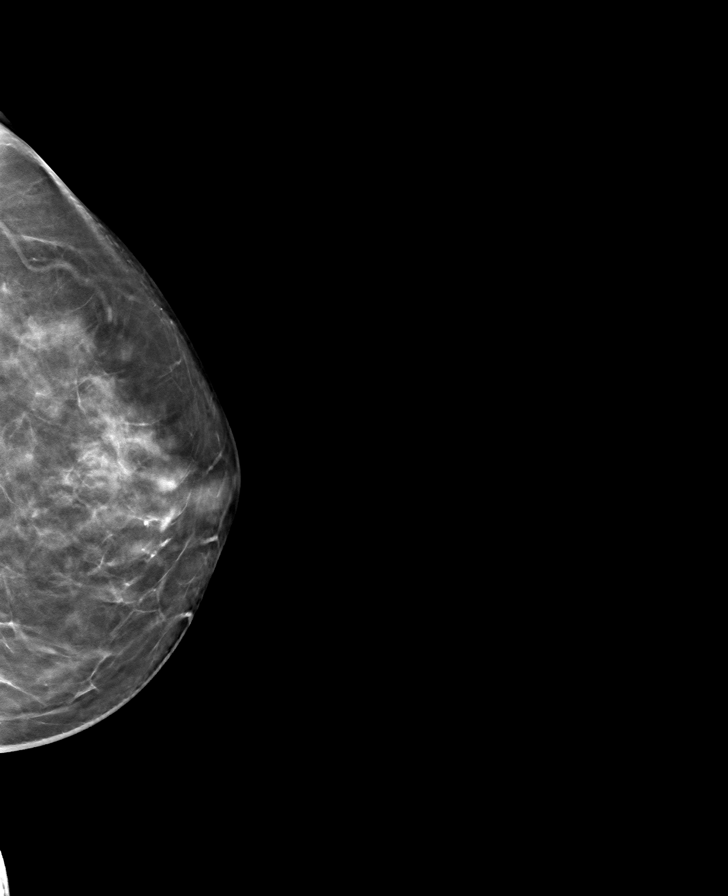

[R MLO tomo · tomo slice 35/68.0]
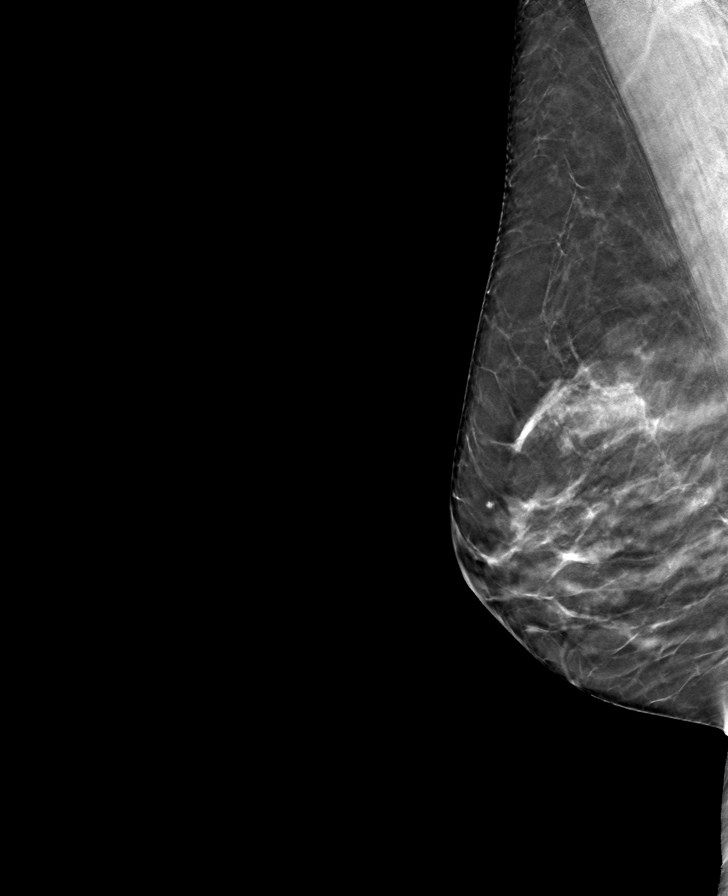

[R CC tomo · tomo slice 37/74.0]
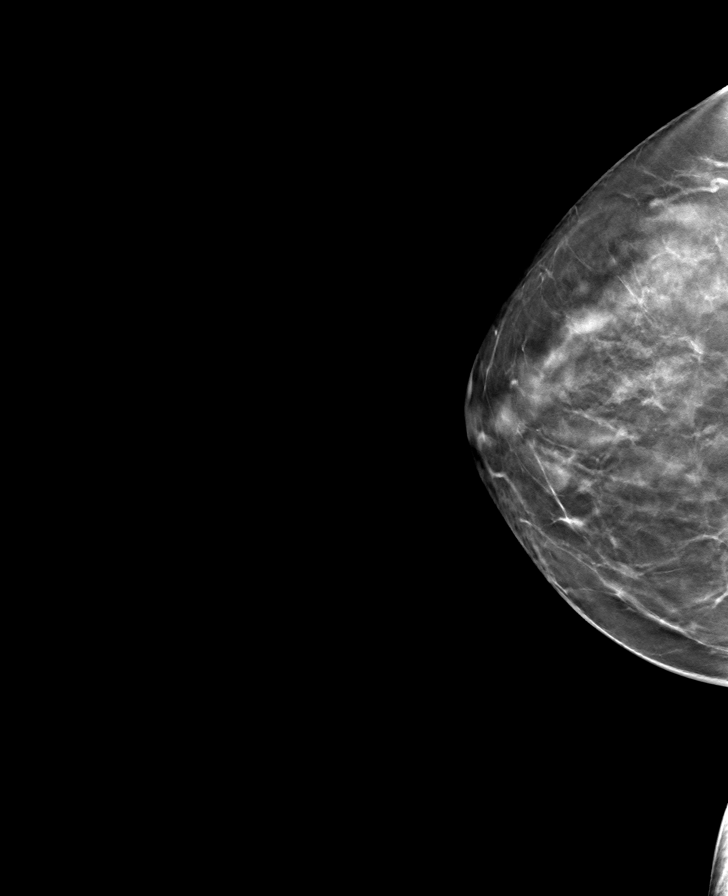

[L MLO tomo · tomo slice 40/79.0]
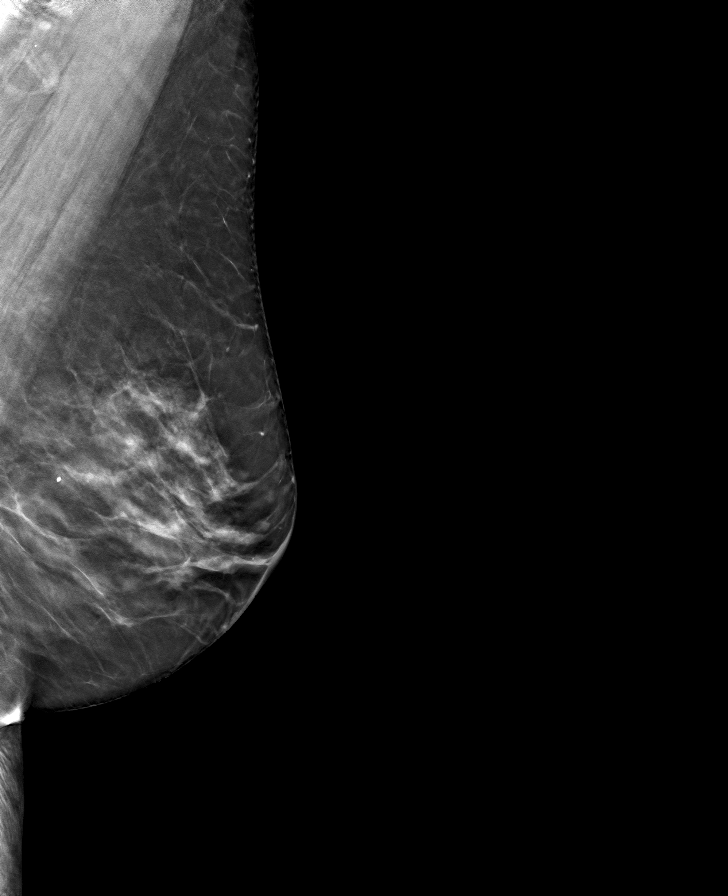

[8 of 24 positions shown; findings below may reference images not displayed]

ACR Breast Density Category c: The breast tissue is heterogeneously
dense, which may obscure small masses.
FINDINGS: There are no findings suspicious for malignancy.
IMPRESSION: No mammographic evidence of malignancy. A result letter of this
screening mammogram will be mailed directly to the patient.

RECOMMENDATION:
Screening mammogram in one year. (Code:Q3-W-BC3)

BI-RADS CATEGORY  1: Negative.

## 2022-05-18 ENCOUNTER — Other Ambulatory Visit: Payer: Self-pay | Admitting: Obstetrics and Gynecology

## 2022-05-18 DIAGNOSIS — Z1231 Encounter for screening mammogram for malignant neoplasm of breast: Secondary | ICD-10-CM

## 2022-07-08 ENCOUNTER — Ambulatory Visit
Admission: RE | Admit: 2022-07-08 | Discharge: 2022-07-08 | Disposition: A | Discharge status: Home / Self Care | Payer: Managed Care, Other (non HMO) | Source: Ambulatory Visit | Attending: Obstetrics and Gynecology | Admitting: Obstetrics and Gynecology

## 2022-07-08 DIAGNOSIS — Z1231 Encounter for screening mammogram for malignant neoplasm of breast: Secondary | ICD-10-CM

## 2022-07-09 ENCOUNTER — Other Ambulatory Visit: Payer: Self-pay | Admitting: Obstetrics and Gynecology

## 2022-07-12 NOTE — H&P (Signed)
Kimberly Barrett is a 62 y.o. female here for Pre Op Consulting (Sign consents) . History of Present Illness: Patient presents for a preoperative visit to schedule a D&C, hysteroscopy, and Myosure polypectomy.   Last pap 05/2022 neg/neg   SIS 06/29/22 \- Uterus: 9.77 x 5.77 x 6.27 cm  - Endometrium: 12.19 mm  - Ovaries: RO wnl, LO with 1.7 cm simple cyst  - Other: fibroids seen: 1) Lt lateral 4 cm, 2) rt lateral 2.7 cm Arcuate uterus, two possible small endometrial polyps one at the fundus. We discussed removal.     Pertinent hx:   -Fractional D&C Hysteroscopy, Myosure Polypectomy on 05/16/2020. Indications for surgery: postmenopausal bleeding, thickened endometrium.  -NSVD 1 for twin daughters, s/p BTL -Started po estrogen 1 mg & po prometrium 100mg  in 2017 for vasomotor symptoms.  -HRT with good results   Past Medical History:  has a past medical history of Anxiety, Fibroid (2022), and Postmenopausal symptoms (12/21/2016).  Past Surgical History:  has a past surgical history that includes Tubal ligation (1993); Colonoscopy (11/21/2017); DNC; Dilation and curettage of uterus; Hysteroscopy; and myosure polypectomy. Family History: family history includes Alcohol abuse in her brother and maternal grandmother; Alzheimer's disease in her father; Arthritis in her mother; Colon polyps in her father; No Known Problems in her daughter and daughter; Stroke in her father, maternal grandmother, and paternal grandmother. Social History:  reports that she quit smoking about 16 years ago. Her smoking use included cigarettes. She has a 25 pack-year smoking history. She has never used smokeless tobacco. She reports current alcohol use of about 3.0 standard drinks of alcohol per week. She reports that she does not use drugs. OB/GYN History:  OB History       Gravida 1   Para 1   Term 1   Preterm     AB     Living 2       SAB     IAB     Ectopic     Molar     Multiple 1   Live  Births            Allergies: has No Known Allergies. Medications:   Current Outpatient Medications:    escitalopram oxalate (LEXAPRO) 10 MG tablet, Take 1 tablet (10 mg total) by mouth once daily, Disp: 90 tablet, Rfl: 3   estradioL (ESTRACE) 1 MG tablet, TAKE 1 TABLET BY MOUTH EVERY DAY, Disp: 90 tablet, Rfl: 1   progesterone (PROMETRIUM) 100 MG capsule, TAKE 1 CAPSULE (100 MG TOTAL) BY MOUTH ONCE DAILY, Disp: 90 capsule, Rfl: 1   amoxicillin (AMOXIL) 500 MG capsule, TAKE 1 CAPSULE BY MOUTH EVERY 6 HOURS (Patient not taking: Reported on 07/12/2022), Disp: , Rfl:    Review of Systems: No SOB, no palpitations or chest pain, no new lower extremity edema, no nausea or vomiting or bowel or bladder complaints. See HPI for gyn specific ROS.    Exam:   BP 123/81   Pulse 79   Ht 165.1 cm (5\' 5" )   Wt 66.5 kg (146 lb 9.6 oz)   BMI 24.40 kg/m    General: Patient is well-groomed, well-nourished, appears stated age in no acute distress   HEENT: head is atraumatic and normocephalic, trachea is midline, neck is supple with no palpable nodules   CV: Regular rhythm and normal heart rate, no murmur   Pulm: Clear to auscultation throughout lung fields with no wheezing, crackles, or rhonchi. No increased work of breathing   Abdomen: soft ,  no mass, non-tender, no rebound tenderness, no hepatomegaly   Pelvic: deferred    A female chaperone was present for the more sensitive portions of the physical exam ( such as breast and pelvic)   Impression:   The encounter diagnosis was PMB (postmenopausal bleeding).   Plan:   1.  Preoperative visit: D&C hysteroscopy, with myosure polypectomy. Consents signed today.  -Risks of surgery were discussed with the patient including but not limited to: bleeding which may require transfusion; infection which may require antibiotics; injury to uterus or surrounding organs; intrauterine scarring which may impair future fertility; need for additional procedures  including laparotomy or laparoscopy; and other postoperative/anesthesia complications. Written informed consent was obtained.   This is a scheduled same-day surgery. She will have a postop visit in 2 weeks to review operative findings and pathology.

## 2022-07-13 ENCOUNTER — Encounter
Admission: RE | Admit: 2022-07-13 | Discharge: 2022-07-13 | Disposition: A | Discharge status: Home / Self Care | Payer: Managed Care, Other (non HMO) | Source: Ambulatory Visit | Attending: Obstetrics and Gynecology | Admitting: Obstetrics and Gynecology

## 2022-07-13 ENCOUNTER — Encounter: Payer: Self-pay | Admitting: Urgent Care

## 2022-07-13 ENCOUNTER — Other Ambulatory Visit: Payer: Self-pay

## 2022-07-13 DIAGNOSIS — N950 Postmenopausal bleeding: Secondary | ICD-10-CM | POA: Insufficient documentation

## 2022-07-13 DIAGNOSIS — Z01812 Encounter for preprocedural laboratory examination: Secondary | ICD-10-CM | POA: Diagnosis present

## 2022-07-13 HISTORY — DX: Anemia, unspecified: D64.9

## 2022-07-13 LAB — CBC
HCT: 35.6 % — ABNORMAL LOW (ref 36.0–46.0)
Hemoglobin: 11.7 g/dL — ABNORMAL LOW (ref 12.0–15.0)
MCH: 31 pg (ref 26.0–34.0)
MCHC: 32.9 g/dL (ref 30.0–36.0)
MCV: 94.2 fL (ref 80.0–100.0)
Platelets: 213 10*3/uL (ref 150–400)
RBC: 3.78 MIL/uL — ABNORMAL LOW (ref 3.87–5.11)
RDW: 11.7 % (ref 11.5–15.5)
WBC: 5.2 10*3/uL (ref 4.0–10.5)
nRBC: 0 % (ref 0.0–0.2)

## 2022-07-13 LAB — TYPE AND SCREEN
ABO/RH(D): O POS
Antibody Screen: NEGATIVE

## 2022-07-13 LAB — BASIC METABOLIC PANEL
Anion gap: 9 (ref 5–15)
BUN: 12 mg/dL (ref 8–23)
CO2: 25 mmol/L (ref 22–32)
Calcium: 8.7 mg/dL — ABNORMAL LOW (ref 8.9–10.3)
Chloride: 103 mmol/L (ref 98–111)
Creatinine, Ser: 0.73 mg/dL (ref 0.44–1.00)
GFR, Estimated: >60 mL/min (ref >60)
Glucose, Bld: 97 mg/dL (ref 70–99)
Potassium: 3.8 mmol/L (ref 3.5–5.1)
Sodium: 137 mmol/L (ref 135–145)

## 2022-07-13 NOTE — Patient Instructions (Signed)
Your procedure is scheduled on: 07/16/22 Report to [facility]. To find out your arrival time please call [phone] between 1PM - 3PM on 07/15/22.  Remember: Instructions that are not followed completely may result in serious medical risk, up to and including death, or upon the discretion of your surgeon and anesthesiologist your surgery may need to be rescheduled.     _X__ 1. Do not eat food after midnight the night before your procedure.                 No gum chewing or hard candies. You may drink clear liquids up to 2 hours                 before you are scheduled to arrive for your surgery- DO not drink clear                 liquids within 2 hours of the start of your surgery.                 Clear Liquids include:  water, apple juice without pulp, clear carbohydrate                 drink such as Clearfast or Gatorade, Black Coffee or Tea (Do not add                 anything to coffee or tea). Diabetics water only  __X__2.  On the morning of surgery brush your teeth with toothpaste and water, you                 may rinse your mouth with mouthwash if you wish.  Do not swallow any              toothpaste of mouthwash.     _X__ 3.  No Alcohol for 24 hours before or after surgery.   _X__ 4.  Do Not Smoke or use e-cigarettes For 24 Hours Prior to Your Surgery.                 Do not use any chewable tobacco products for at least 6 hours prior to                 surgery.  ____  5.  Bring all medications with you on the day of surgery if instructed.   __X__  6.  Notify your doctor if there is any change in your medical condition      (cold, fever, infections).     Do not wear jewelry, make-up, hairpins, clips or nail polish. Do not wear lotions, powders, or perfumes. You may wear deodorant Do not shave body hair 48 hours prior to surgery. Men may shave face and neck. Do not bring valuables to the hospital.    [facility] is not  responsible for any belongings or valuables.  Contacts, dentures/partials or body piercings may not be worn into surgery. Bring a case for your contacts, glasses or hearing aids, a denture cup will be supplied. Leave your suitcase in the car. After surgery it may be brought to your room. For patients admitted to the hospital, discharge time is determined by your treatment team.   Patients discharged the day of surgery will need an adult driver. You will not be allowed to drive yourself home, take an uber, lyft or taxi. You may use medical transportation such as [place] as long as you have  an adult with you or waiting to assist you upon your arrival home. You must have an adult over 1 years old in the home with you for the first 24 hours after surgery   Please read over the following fact sheets that you were given:   Incentive spirometer  __X__ Take these medicines the morning of surgery with A SIP OF WATER:    1. escitalopram (LEXAPRO) 10 MG tablet   2. estradiol (ESTRACE) 1 MG tablet   3. progesterone (PROMETRIUM) 100 MG capsule   4.  5.  6.  ____ Fleet Enema (as directed)   ____ Use CHG Soap/SAGE wipes as directed  ____ Use inhalers on the day of surgery  ____ Stop metformin/Janumet/Farxiga 2 days prior to surgery    ____ Take 1/2 of usual insulin dose the night before surgery. No insulin the morning          of surgery.   ____ Stop Blood Thinners Coumadin/Plavix/Xarelto/Pleta/Pradaxa/Eliquis/Effient/Aspirin  on   Or contact your Surgeon, Cardiologist or Medical Doctor regarding  ability to stop your blood thinners  __X__ Stop Anti-inflammatories 7 days before surgery such as Advil, Ibuprofen, Motrin,  BC or Goodies Powder, Naprosyn, Naproxen, Aleve, Aspirin You may substitute Tylenol as needed   __X__ Stop all herbals and supplements, fish oil or vitamins for 7 days    ____ Bring C-Pap to the hospital.

## 2022-07-16 ENCOUNTER — Ambulatory Visit: Payer: Managed Care, Other (non HMO) | Admitting: Anesthesiology

## 2022-07-16 ENCOUNTER — Ambulatory Visit
Admission: RE | Admit: 2022-07-16 | Discharge: 2022-07-16 | Disposition: A | Discharge status: Home / Self Care | Payer: Managed Care, Other (non HMO) | Attending: Obstetrics and Gynecology | Admitting: Obstetrics and Gynecology

## 2022-07-16 ENCOUNTER — Encounter
Admission: RE | Disposition: A | Discharge status: Home / Self Care | Payer: Self-pay | Source: Home / Self Care | Attending: Obstetrics and Gynecology

## 2022-07-16 ENCOUNTER — Other Ambulatory Visit: Payer: Self-pay

## 2022-07-16 ENCOUNTER — Encounter: Payer: Self-pay | Admitting: Obstetrics and Gynecology

## 2022-07-16 DIAGNOSIS — N950 Postmenopausal bleeding: Secondary | ICD-10-CM

## 2022-07-16 DIAGNOSIS — Z79899 Other long term (current) drug therapy: Secondary | ICD-10-CM | POA: Diagnosis not present

## 2022-07-16 DIAGNOSIS — Z87891 Personal history of nicotine dependence: Secondary | ICD-10-CM | POA: Insufficient documentation

## 2022-07-16 DIAGNOSIS — N879 Dysplasia of cervix uteri, unspecified: Secondary | ICD-10-CM | POA: Diagnosis not present

## 2022-07-16 DIAGNOSIS — F419 Anxiety disorder, unspecified: Secondary | ICD-10-CM | POA: Insufficient documentation

## 2022-07-16 DIAGNOSIS — Z79890 Hormone replacement therapy (postmenopausal): Secondary | ICD-10-CM | POA: Diagnosis not present

## 2022-07-16 HISTORY — PX: HYSTEROSCOPY WITH D & C: SHX1775

## 2022-07-16 LAB — GLUCOSE, CAPILLARY: Glucose-Capillary: 99 mg/dL (ref 70–99)

## 2022-07-16 SURGERY — DILATATION AND CURETTAGE /HYSTEROSCOPY
Anesthesia: General

## 2022-07-16 MED ORDER — ACETAMINOPHEN 10 MG/ML IV SOLN
INTRAVENOUS | Status: AC
  Filled 2022-07-16: qty 100

## 2022-07-16 MED ORDER — MIDAZOLAM HCL 2 MG/2ML IJ SOLN
INTRAMUSCULAR | Status: DC | PRN
  Administered 2022-07-16: 2 mg via INTRAVENOUS

## 2022-07-16 MED ORDER — DEXAMETHASONE SODIUM PHOSPHATE 10 MG/ML IJ SOLN
INTRAMUSCULAR | Status: DC | PRN
  Administered 2022-07-16: 10 mg via INTRAVENOUS

## 2022-07-16 MED ORDER — LIDOCAINE HCL (CARDIAC) PF 100 MG/5ML IV SOSY
PREFILLED_SYRINGE | INTRAVENOUS | Status: DC | PRN
  Administered 2022-07-16: 100 mg via INTRAVENOUS

## 2022-07-16 MED ORDER — OXYCODONE HCL 5 MG PO TABS: 5.0000 mg | ORAL_TABLET | Freq: Once | ORAL | Status: DC | PRN

## 2022-07-16 MED ORDER — KETOROLAC TROMETHAMINE 30 MG/ML IJ SOLN
INTRAMUSCULAR | Status: DC | PRN
  Administered 2022-07-16: 30 mg via INTRAVENOUS

## 2022-07-16 MED ORDER — KETOROLAC TROMETHAMINE 30 MG/ML IJ SOLN
INTRAMUSCULAR | Status: AC
  Filled 2022-07-16: qty 1

## 2022-07-16 MED ORDER — MIDAZOLAM HCL 2 MG/2ML IJ SOLN
INTRAMUSCULAR | Status: AC
  Filled 2022-07-16: qty 2

## 2022-07-16 MED ORDER — ACETAMINOPHEN 10 MG/ML IV SOLN
INTRAVENOUS | Status: DC | PRN
  Administered 2022-07-16: 1000 mg via INTRAVENOUS

## 2022-07-16 MED ORDER — EPHEDRINE 5 MG/ML INJ
INTRAVENOUS | Status: AC
  Filled 2022-07-16: qty 5

## 2022-07-16 MED ORDER — FENTANYL CITRATE (PF) 100 MCG/2ML IJ SOLN
INTRAMUSCULAR | Status: DC | PRN
  Administered 2022-07-16 (×2): 25 ug via INTRAVENOUS
  Administered 2022-07-16: 50 ug via INTRAVENOUS

## 2022-07-16 MED ORDER — ORAL CARE MOUTH RINSE: 15.0000 mL | Freq: Once | OROMUCOSAL | Status: AC

## 2022-07-16 MED ORDER — GLYCOPYRROLATE 0.2 MG/ML IJ SOLN
INTRAMUSCULAR | Status: AC
  Filled 2022-07-16: qty 1

## 2022-07-16 MED ORDER — SEVOFLURANE IN SOLN
RESPIRATORY_TRACT | Status: AC
  Filled 2022-07-16: qty 250

## 2022-07-16 MED ORDER — FENTANYL CITRATE (PF) 100 MCG/2ML IJ SOLN: 25.0000 ug | INTRAMUSCULAR | Status: DC | PRN

## 2022-07-16 MED ORDER — PROPOFOL 10 MG/ML IV BOLUS
INTRAVENOUS | Status: DC | PRN
  Administered 2022-07-16: 30 mg via INTRAVENOUS
  Administered 2022-07-16: 80 mg via INTRAVENOUS
  Administered 2022-07-16: 120 mg via INTRAVENOUS

## 2022-07-16 MED ORDER — FAMOTIDINE 20 MG PO TABS
ORAL_TABLET | ORAL | Status: AC
  Administered 2022-07-16: 20 mg via ORAL
  Filled 2022-07-16: qty 1

## 2022-07-16 MED ORDER — CHLORHEXIDINE GLUCONATE 0.12 % MT SOLN: 15.0000 mL | Freq: Once | OROMUCOSAL | Status: AC

## 2022-07-16 MED ORDER — PROPOFOL 10 MG/ML IV BOLUS
INTRAVENOUS | Status: AC
  Filled 2022-07-16: qty 20

## 2022-07-16 MED ORDER — DEXAMETHASONE SODIUM PHOSPHATE 10 MG/ML IJ SOLN
INTRAMUSCULAR | Status: AC
  Filled 2022-07-16: qty 1

## 2022-07-16 MED ORDER — POVIDONE-IODINE 10 % EX SWAB
2.0000 | Freq: Once | CUTANEOUS | Status: AC
  Administered 2022-07-16: 2 via TOPICAL

## 2022-07-16 MED ORDER — FAMOTIDINE 20 MG PO TABS: 20.0000 mg | ORAL_TABLET | Freq: Once | ORAL | Status: AC

## 2022-07-16 MED ORDER — ONDANSETRON HCL 4 MG/2ML IJ SOLN
INTRAMUSCULAR | Status: DC | PRN
  Administered 2022-07-16: 4 mg via INTRAVENOUS

## 2022-07-16 MED ORDER — FENTANYL CITRATE (PF) 100 MCG/2ML IJ SOLN
INTRAMUSCULAR | Status: AC
  Filled 2022-07-16: qty 2

## 2022-07-16 MED ORDER — CHLORHEXIDINE GLUCONATE 0.12 % MT SOLN
OROMUCOSAL | Status: AC
  Administered 2022-07-16: 15 mL via OROMUCOSAL
  Filled 2022-07-16: qty 15

## 2022-07-16 MED ORDER — LIDOCAINE HCL (PF) 2 % IJ SOLN
INTRAMUSCULAR | Status: AC
  Filled 2022-07-16: qty 5

## 2022-07-16 MED ORDER — GLYCOPYRROLATE 0.2 MG/ML IJ SOLN
INTRAMUSCULAR | Status: DC | PRN
  Administered 2022-07-16: .2 mg via INTRAVENOUS

## 2022-07-16 MED ORDER — SILVER NITRATE-POT NITRATE 75-25 % EX MISC
CUTANEOUS | Status: AC
  Filled 2022-07-16: qty 10

## 2022-07-16 MED ORDER — EPHEDRINE SULFATE (PRESSORS) 50 MG/ML IJ SOLN
INTRAMUSCULAR | Status: DC | PRN
  Administered 2022-07-16: 10 mg via INTRAVENOUS

## 2022-07-16 MED ORDER — OXYCODONE HCL 5 MG/5ML PO SOLN: 5.0000 mg | Freq: Once | ORAL | Status: DC | PRN

## 2022-07-16 MED ORDER — ONDANSETRON HCL 4 MG/2ML IJ SOLN
INTRAMUSCULAR | Status: AC
  Filled 2022-07-16: qty 2

## 2022-07-16 MED ORDER — LACTATED RINGERS IV SOLN: INTRAVENOUS | Status: DC

## 2022-07-16 SURGICAL SUPPLY — 22 items
BAG PRESSURE INF REUSE 1000 (BAG) ×1 IMPLANT
BASIN KIT SINGLE STR (MISCELLANEOUS) ×1 IMPLANT
DEVICE MYOSURE LITE (MISCELLANEOUS) IMPLANT
DRSG TELFA 3X8 NADH STRL (GAUZE/BANDAGES/DRESSINGS) IMPLANT
ELECT REM PT RETURN 9FT ADLT (ELECTROSURGICAL) ×1
ELECTRODE REM PT RTRN 9FT ADLT (ELECTROSURGICAL) ×1 IMPLANT
GLOVE BIO SURGEON STRL SZ7 (GLOVE) ×1 IMPLANT
GLOVE SURG UNDER LTX SZ7.5 (GLOVE) ×1 IMPLANT
GOWN STRL REUS W/ TWL LRG LVL3 (GOWN DISPOSABLE) ×2 IMPLANT
GOWN STRL REUS W/TWL LRG LVL3 (GOWN DISPOSABLE) ×4
IV NS IRRIG 3000ML ARTHROMATIC (IV SOLUTION) ×1 IMPLANT
KIT PROCEDURE FLUENT (KITS) ×1 IMPLANT
KIT TURNOVER CYSTO (KITS) ×1 IMPLANT
MANIFOLD NEPTUNE II (INSTRUMENTS) ×1 IMPLANT
PACK DNC HYST (MISCELLANEOUS) ×1 IMPLANT
PAD PREP 24X41 OB/GYN DISP (PERSONAL CARE ITEMS) ×1 IMPLANT
SCRUB CHG 4% DYNA-HEX 4OZ (MISCELLANEOUS) ×1 IMPLANT
SEAL ROD LENS SCOPE MYOSURE (ABLATOR) ×1 IMPLANT
SET CYSTO W/LG BORE CLAMP LF (SET/KITS/TRAYS/PACK) IMPLANT
TRAP FLUID SMOKE EVACUATOR (MISCELLANEOUS) ×1 IMPLANT
TUBING CONNECTING 10 (TUBING) ×1 IMPLANT
WATER STERILE IRR 500ML POUR (IV SOLUTION) ×1 IMPLANT

## 2022-07-16 NOTE — Op Note (Signed)
Operative Report Hysteroscopy with Dilation and Curettage   Indications: persistent postmenopausal bleeding   Pre-operative Diagnosis: Suspected endometrial polyp, atrophic uterus   Post-operative Diagnosis: same.  Procedure: 1. Exam under anesthesia 2. Fractional D&C 3. Hysteroscopy 4. Myosure polypectomy  Surgeon: Benjaman Kindler, MD  Assistant(s):  None  Anesthesia: General LMA anesthesia  Anesthesiologist: Piscitello, Precious Haws, MD Anesthesiologist: Piscitello, Precious Haws, MD CRNA: Cammie Sickle, CRNA  Estimated Blood Loss:  Minimal         Total IV Fluids: 635ml  Urine Output: 46ml  Total Fluid Deficit:  170 mL          Specimens: Endocervical curettings, endometrial curettings         Complications:  None; patient tolerated the procedure well.         Disposition: PACU - hemodynamically stable.         Condition: stable  Findings: Uterus measuring 6 cm by sound; normal cervix, vagina, perineum. Two small possible polyps at the fundus. Normal atrophic but small cavity noted, retroverted.  Indication for procedure/Consents: 62 y.o. F here for scheduled surgery for the aforementioned diagnoses.     Risks of surgery were discussed with the patient including but not limited to: bleeding which may require transfusion; infection which may require antibiotics; injury to uterus or surrounding organs; intrauterine scarring which may impair future fertility; need for additional procedures including laparotomy or laparoscopy; and other postoperative/anesthesia complications. Written informed consent was obtained.    Procedure Details:  D&C/ Myosure  The patient was taken to the operating room where anesthesia was administered and was found to be adequate.  After a formal and adequate timeout was performed, she was placed in the dorsal lithotomy position and examined with the above findings. She was then prepped and draped in the sterile manner.   Her bladder was catheterized  for an estimated amount of clear, yellow urine. A weighed speculum was then placed in the patient's vagina and a single tooth tenaculum was applied to the anterior lip of the cervix.  Her cervix was serially dilated to 15 [place] using Hanks dilators.  An ECC was performed. The hysteroscope was introduced under direct observation  Using lactated ringers as a distention medium to reveal the above findings. The uterine cavity was carefully examined, both ostia were recognized, and diffusely atrophic endometrium with possible polyps at the fundus as above were noted.   This was resected using the Myosure device.  After further careful visualization of the uterine cavity, the hysteroscope was removed under direct visualization.  A sharp curettage was then performed until there was a gritty texture in all four quadrants.  The tenaculum was removed from the anterior lip of the cervix and the vaginal speculum was removed after applying silver nitrate for good hemostasis.   The patient tolerated the procedure well and was taken to the recovery area awake and in stable condition. She received iv acetaminophen and Toradol prior to leaving the OR.  The patient will be discharged to home as per PACU criteria. Routine postoperative instructions given.  She was prescribed Ibuprofen and Colace.  She will follow up in the clinic in two weeks for postoperative evaluation.

## 2022-07-16 NOTE — Anesthesia Preprocedure Evaluation (Signed)
Anesthesia Evaluation  Patient identified by MRN, date of birth, ID band Patient awake    Reviewed: Allergy & Precautions, NPO status , Patient's Chart, lab work & pertinent test results  History of Anesthesia Complications Negative for: history of anesthetic complications  Airway Mallampati: III  TM Distance: >3 FB Neck ROM: full    Dental  (+) Chipped   Pulmonary neg shortness of breath, former smoker   Pulmonary exam normal        Cardiovascular Exercise Tolerance: Good (-) angina (-) Past MI and (-) DOE Normal cardiovascular exam     Neuro/Psych   Anxiety     negative neurological ROS     GI/Hepatic negative GI ROS, Neg liver ROS,neg GERD  ,,  Endo/Other  negative endocrine ROS    Renal/GU      Musculoskeletal   Abdominal   Peds  Hematology negative hematology ROS (+)   Anesthesia Other Findings Past Medical History: No date: Anemia No date: Anxiety 04/2020: Fracture     Comment:  LEFT FOOT  Past Surgical History: No date: COLONOSCOPY 05/16/2020: HYSTEROSCOPY WITH D & C; N/A     Comment:  Procedure: DILATATION AND CURETTAGE /HYSTEROSCOPY,               POLYPECTOMY;  Surgeon: Benjaman Kindler, MD;  Location:               [facility] ORS;  Service: Gynecology;  Laterality: N/A; No date: TUBAL LIGATION  BMI    Body Mass Index: 23.40 kg/m      Reproductive/Obstetrics negative OB ROS                             Anesthesia Physical Anesthesia Plan  ASA: 2  Anesthesia Plan: General LMA   Post-op Pain Management:    Induction: Intravenous  PONV Risk Score and Plan: Dexamethasone, Ondansetron, Midazolam and Treatment may vary due to age or medical condition  Airway Management Planned: LMA  Additional Equipment:   Intra-op Plan:   Post-operative Plan: Extubation in OR  Informed Consent: I have reviewed the patients History and Physical, chart, labs and discussed the  procedure including the risks, benefits and alternatives for the proposed anesthesia with the patient or authorized representative who has indicated his/her understanding and acceptance.     Dental Advisory Given  Plan Discussed with: Anesthesiologist, CRNA and Surgeon  Anesthesia Plan Comments: (Patient consented for risks of anesthesia including but not limited to:  - adverse reactions to medications - damage to eyes, teeth, lips or other oral mucosa - nerve damage due to positioning  - sore throat or hoarseness - Damage to heart, brain, nerves, lungs, other parts of body or loss of life  Patient voiced understanding.)       Anesthesia Quick Evaluation

## 2022-07-16 NOTE — Discharge Instructions (Addendum)
AMBULATORY SURGERY  DISCHARGE INSTRUCTIONS   The drugs that you were given will stay in your system until tomorrow so for the next 24 hours you should not:  Drive an automobile Make any legal decisions Drink any alcoholic beverage   You may resume regular meals tomorrow.  Today it is better to start with liquids and gradually work up to solid foods.  You may eat anything you prefer, but it is better to start with liquids, then soup and crackers, and gradually work up to solid foods.   Please notify your doctor immediately if you have any unusual bleeding, trouble breathing, redness and pain at the surgery site, drainage, fever, or pain not relieved by medication.    Additional Instructions:        Please contact your physician with any problems or Same Day Surgery at [phone], Monday through Friday 6 am to 4 pm, or [place] at  Main number at [phone].Discharge instructions after a hysteroscopy with dilation and curettage  Signs and Symptoms to Report  Call our office at [phone] if you have any of the following:    Fever over 100.4 degrees or higher  Severe stomach pain not relieved with pain medications  Bright red bleeding that's heavier than a period that does not slow with rest after the first 24 hours  To go the bathroom a lot (frequency), you can't hold your urine (urgency), or it hurts when you empty your bladder (urinate)  Chest pain  Shortness of breath  Pain in the calves of your legs  Severe nausea and vomiting not relieved with anti-nausea medications  Any concerns  What You Can Expect after Surgery  You may see some pink tinged, bloody fluid. This is normal. You may also have cramping for several days.   Activities after Your Discharge Follow these guidelines to help speed your recovery at home:  Don't drive if you are in pain or taking narcotic pain medicine. You may drive when you can safely slam on the brakes, turn the wheel  forcefully, and rotate your torso comfortably. This is typically 4-7 days. Practice in a parking lot or side street prior to attempting to drive regularly.   Ask others to help with household chores for 4 weeks.  Don't do strenuous activities, exercises, or sports like vacuuming, tennis, squash, etc. until your doctor says it is safe to do so.  Walk as you feel able. Rest often since it may take a week or two for your energy level to return to normal.   You may climb stairs  Avoid constipation:   -Eat fruits, vegetables, and whole grains. Eat small meals as your appetite will take time to return to normal.   -Drink 6 to 8 glasses of water each day unless your doctor has told you to limit your fluids.   -Use a laxative or stool softener as needed if constipation becomes a problem. You may take Miralax, metamucil, Citrucil, Colace, [place], FiberCon, etc. If this does not relieve the constipation, try two tablespoons of Milk Of Magnesia every 8 hours until your bowels move.   You may shower.   Do not get in a hot tub, swimming pool, etc. until your doctor agrees.  Do not douche, use tampons, or have sex until your doctor says it is okay, usually about 2 weeks.  Take your pain medicine when you need it. The medicine may not work as well if the pain is bad.  Take the medicines you were   taking before surgery. Other medications you might need are pain medications (ibuprofen), medications for constipation (Colace) and nausea medications (Zofran).    

## 2022-07-16 NOTE — Interval H&P Note (Signed)
History and Physical Interval Note:  07/16/2022 7:20 AM  Kimberly Barrett  has presented today for surgery, with the diagnosis of postmenopausal bleeding.  The various methods of treatment have been discussed with the patient and family. After consideration of risks, benefits and other options for treatment, the patient has consented to  Procedure(s): DILATATION AND CURETTAGE /HYSTEROSCOPY/ POLYPECTOMY (N/A) as a surgical intervention.  The patient's history has been reviewed, patient examined, no change in status, stable for surgery.  I have reviewed the patient's chart and labs.  Questions were answered to the patient's satisfaction.     Benjaman Kindler

## 2022-07-16 NOTE — Transfer of Care (Signed)
Immediate Anesthesia Transfer of Care Note  Patient: Kimberly Barrett  Procedure(s) Performed: DILATATION AND CURETTAGE /HYSTEROSCOPY/ POLYPECTOMY  Patient Location: PACU  Anesthesia Type:General  Level of Consciousness: drowsy  Airway & Oxygen Therapy: Patient Spontanous Breathing and Patient connected to face mask oxygen  Post-op Assessment: Report given to RN and Post -op Vital signs reviewed and stable  Post vital signs: Reviewed and stable  Last Vitals:  Vitals Value Taken Time  BP 147/78 07/16/22 0825  Temp 35.9 0825  Pulse 80 07/16/22 0828  Resp 17 07/16/22 0828  SpO2 100 % 07/16/22 0828  Vitals shown include unvalidated device data.  Last Pain:  Vitals:   07/16/22 0612  TempSrc: Temporal  PainSc: 0-No pain         Complications: No notable events documented.

## 2022-07-16 NOTE — Anesthesia Postprocedure Evaluation (Signed)
Anesthesia Post Note  Patient: Kimberly Barrett  Procedure(s) Performed: DILATATION AND CURETTAGE /HYSTEROSCOPY/ POLYPECTOMY  Patient location during evaluation: PACU Anesthesia Type: General Level of consciousness: awake and alert Pain management: pain level controlled Vital Signs Assessment: post-procedure vital signs reviewed and stable Respiratory status: spontaneous breathing, nonlabored ventilation, respiratory function stable and patient connected to nasal cannula oxygen Cardiovascular status: blood pressure returned to baseline and stable Postop Assessment: no apparent nausea or vomiting Anesthetic complications: no   No notable events documented.   Last Vitals:  Vitals:   07/16/22 0845 07/16/22 0855  BP: 134/79 (!) 155/85  Pulse: 64 63  Resp: 16 16  Temp: (!) 36.1 C (!) 36.1 C  SpO2: 99% 97%    Last Pain:  Vitals:   07/16/22 0855  TempSrc: Temporal  PainSc: [place]

## 2022-07-16 NOTE — Anesthesia Procedure Notes (Signed)
Procedure Name: LMA Insertion Date/Time: 07/16/2022 7:32 AM  Performed by: Cammie Sickle, CRNAPre-anesthesia Checklist: Patient identified, Patient being monitored, Timeout performed, Emergency Drugs available and Suction available Patient Re-evaluated:Patient Re-evaluated prior to induction Oxygen Delivery Method: Circle system utilized Preoxygenation: Pre-oxygenation with 100% oxygen Induction Type: IV induction Ventilation: Mask ventilation without difficulty LMA: LMA inserted LMA Size: 3.0 Tube type: Oral Number of attempts: 1 Placement Confirmation: positive ETCO2 and breath sounds checked- equal and bilateral Tube secured with: Tape Dental Injury: Teeth and Oropharynx as per pre-operative assessment

## 2022-07-19 LAB — SURGICAL PATHOLOGY

## 2023-05-06 ENCOUNTER — Ambulatory Visit: Payer: Managed Care, Other (non HMO)

## 2023-05-06 DIAGNOSIS — Z860100 Personal history of colon polyps, unspecified: Secondary | ICD-10-CM | POA: Diagnosis not present

## 2023-05-06 DIAGNOSIS — D122 Benign neoplasm of ascending colon: Secondary | ICD-10-CM | POA: Diagnosis not present

## 2023-05-06 DIAGNOSIS — Z09 Encounter for follow-up examination after completed treatment for conditions other than malignant neoplasm: Secondary | ICD-10-CM | POA: Diagnosis present
